# Patient Record
Sex: Female | Born: 1970 | Race: White | Hispanic: Yes | Marital: Married | Smoking: Never smoker
Health system: Southern US, Community
[De-identification: ages and names within clinical notes are randomized; demographics above are authoritative.]

## PROBLEM LIST (undated history)

## (undated) DIAGNOSIS — F419 Anxiety disorder, unspecified: Secondary | ICD-10-CM

## (undated) DIAGNOSIS — E785 Hyperlipidemia, unspecified: Secondary | ICD-10-CM

## (undated) DIAGNOSIS — R42 Dizziness and giddiness: Secondary | ICD-10-CM

## (undated) DIAGNOSIS — J309 Allergic rhinitis, unspecified: Secondary | ICD-10-CM

## (undated) DIAGNOSIS — M159 Polyosteoarthritis, unspecified: Secondary | ICD-10-CM

## (undated) DIAGNOSIS — K589 Irritable bowel syndrome without diarrhea: Secondary | ICD-10-CM

## (undated) DIAGNOSIS — M549 Dorsalgia, unspecified: Secondary | ICD-10-CM

## (undated) DIAGNOSIS — K219 Gastro-esophageal reflux disease without esophagitis: Secondary | ICD-10-CM

## (undated) HISTORY — DX: Dorsalgia, unspecified: M54.9

## (undated) HISTORY — DX: Irritable bowel syndrome, unspecified: K58.9

## (undated) HISTORY — PX: NO PAST SURGERIES: SHX2092

## (undated) HISTORY — DX: Dizziness and giddiness: R42

## (undated) HISTORY — DX: Gastro-esophageal reflux disease without esophagitis: K21.9

## (undated) HISTORY — DX: Allergic rhinitis, unspecified: J30.9

## (undated) HISTORY — DX: Anxiety disorder, unspecified: F41.9

## (undated) HISTORY — DX: Polyosteoarthritis, unspecified: M15.9

## (undated) HISTORY — DX: Hyperlipidemia, unspecified: E78.5

---

## 2016-09-13 DIAGNOSIS — E559 Vitamin D deficiency, unspecified: Secondary | ICD-10-CM | POA: Diagnosis not present

## 2016-09-13 DIAGNOSIS — J22 Unspecified acute lower respiratory infection: Secondary | ICD-10-CM | POA: Diagnosis not present

## 2016-09-13 DIAGNOSIS — R42 Dizziness and giddiness: Secondary | ICD-10-CM | POA: Diagnosis not present

## 2016-09-13 DIAGNOSIS — E785 Hyperlipidemia, unspecified: Secondary | ICD-10-CM | POA: Diagnosis not present

## 2016-10-02 DIAGNOSIS — R200 Anesthesia of skin: Secondary | ICD-10-CM | POA: Diagnosis not present

## 2016-10-02 DIAGNOSIS — J22 Unspecified acute lower respiratory infection: Secondary | ICD-10-CM | POA: Diagnosis not present

## 2016-10-02 DIAGNOSIS — E785 Hyperlipidemia, unspecified: Secondary | ICD-10-CM | POA: Diagnosis not present

## 2016-10-12 DIAGNOSIS — R509 Fever, unspecified: Secondary | ICD-10-CM | POA: Diagnosis not present

## 2016-10-12 DIAGNOSIS — J0100 Acute maxillary sinusitis, unspecified: Secondary | ICD-10-CM | POA: Diagnosis not present

## 2016-10-12 DIAGNOSIS — J209 Acute bronchitis, unspecified: Secondary | ICD-10-CM | POA: Diagnosis not present

## 2016-12-29 DIAGNOSIS — R0789 Other chest pain: Secondary | ICD-10-CM | POA: Diagnosis not present

## 2016-12-29 DIAGNOSIS — K219 Gastro-esophageal reflux disease without esophagitis: Secondary | ICD-10-CM | POA: Diagnosis not present

## 2017-02-01 DIAGNOSIS — Z1231 Encounter for screening mammogram for malignant neoplasm of breast: Secondary | ICD-10-CM | POA: Diagnosis not present

## 2018-02-11 DIAGNOSIS — K219 Gastro-esophageal reflux disease without esophagitis: Secondary | ICD-10-CM | POA: Diagnosis not present

## 2018-02-11 DIAGNOSIS — Z309 Encounter for contraceptive management, unspecified: Secondary | ICD-10-CM | POA: Diagnosis not present

## 2018-02-19 DIAGNOSIS — Z1231 Encounter for screening mammogram for malignant neoplasm of breast: Secondary | ICD-10-CM | POA: Diagnosis not present

## 2018-08-08 DIAGNOSIS — M549 Dorsalgia, unspecified: Secondary | ICD-10-CM | POA: Diagnosis not present

## 2018-08-08 DIAGNOSIS — M159 Polyosteoarthritis, unspecified: Secondary | ICD-10-CM | POA: Diagnosis not present

## 2018-08-08 DIAGNOSIS — K219 Gastro-esophageal reflux disease without esophagitis: Secondary | ICD-10-CM | POA: Diagnosis not present

## 2018-08-22 DIAGNOSIS — M549 Dorsalgia, unspecified: Secondary | ICD-10-CM | POA: Diagnosis not present

## 2018-08-22 DIAGNOSIS — E785 Hyperlipidemia, unspecified: Secondary | ICD-10-CM | POA: Diagnosis not present

## 2018-08-22 DIAGNOSIS — M159 Polyosteoarthritis, unspecified: Secondary | ICD-10-CM | POA: Diagnosis not present

## 2018-08-27 DIAGNOSIS — E785 Hyperlipidemia, unspecified: Secondary | ICD-10-CM | POA: Diagnosis not present

## 2018-08-27 DIAGNOSIS — K219 Gastro-esophageal reflux disease without esophagitis: Secondary | ICD-10-CM | POA: Diagnosis not present

## 2018-08-27 DIAGNOSIS — M159 Polyosteoarthritis, unspecified: Secondary | ICD-10-CM | POA: Diagnosis not present

## 2018-09-13 DIAGNOSIS — M159 Polyosteoarthritis, unspecified: Secondary | ICD-10-CM | POA: Diagnosis not present

## 2018-09-13 DIAGNOSIS — K219 Gastro-esophageal reflux disease without esophagitis: Secondary | ICD-10-CM | POA: Diagnosis not present

## 2018-09-13 DIAGNOSIS — E785 Hyperlipidemia, unspecified: Secondary | ICD-10-CM | POA: Diagnosis not present

## 2018-10-18 DIAGNOSIS — E785 Hyperlipidemia, unspecified: Secondary | ICD-10-CM | POA: Diagnosis not present

## 2018-10-18 DIAGNOSIS — J069 Acute upper respiratory infection, unspecified: Secondary | ICD-10-CM | POA: Diagnosis not present

## 2018-10-18 DIAGNOSIS — M159 Polyosteoarthritis, unspecified: Secondary | ICD-10-CM | POA: Diagnosis not present

## 2018-11-14 DIAGNOSIS — M159 Polyosteoarthritis, unspecified: Secondary | ICD-10-CM | POA: Diagnosis not present

## 2018-11-14 DIAGNOSIS — M25512 Pain in left shoulder: Secondary | ICD-10-CM | POA: Diagnosis not present

## 2018-11-14 DIAGNOSIS — E785 Hyperlipidemia, unspecified: Secondary | ICD-10-CM | POA: Diagnosis not present

## 2018-11-17 DIAGNOSIS — Z09 Encounter for follow-up examination after completed treatment for conditions other than malignant neoplasm: Secondary | ICD-10-CM | POA: Diagnosis not present

## 2018-11-28 DIAGNOSIS — M159 Polyosteoarthritis, unspecified: Secondary | ICD-10-CM | POA: Diagnosis not present

## 2018-11-28 DIAGNOSIS — K219 Gastro-esophageal reflux disease without esophagitis: Secondary | ICD-10-CM | POA: Diagnosis not present

## 2018-11-28 DIAGNOSIS — J208 Acute bronchitis due to other specified organisms: Secondary | ICD-10-CM | POA: Diagnosis not present

## 2021-06-03 ENCOUNTER — Encounter: Payer: Self-pay | Admitting: Cardiology

## 2021-06-03 DIAGNOSIS — K589 Irritable bowel syndrome without diarrhea: Secondary | ICD-10-CM | POA: Insufficient documentation

## 2021-06-03 DIAGNOSIS — K219 Gastro-esophageal reflux disease without esophagitis: Secondary | ICD-10-CM | POA: Insufficient documentation

## 2021-06-03 DIAGNOSIS — J309 Allergic rhinitis, unspecified: Secondary | ICD-10-CM | POA: Insufficient documentation

## 2021-06-03 DIAGNOSIS — E785 Hyperlipidemia, unspecified: Secondary | ICD-10-CM | POA: Insufficient documentation

## 2021-06-03 DIAGNOSIS — M159 Polyosteoarthritis, unspecified: Secondary | ICD-10-CM

## 2021-06-07 DIAGNOSIS — F419 Anxiety disorder, unspecified: Secondary | ICD-10-CM | POA: Insufficient documentation

## 2021-06-07 DIAGNOSIS — R42 Dizziness and giddiness: Secondary | ICD-10-CM | POA: Insufficient documentation

## 2021-06-07 DIAGNOSIS — M549 Dorsalgia, unspecified: Secondary | ICD-10-CM | POA: Insufficient documentation

## 2021-06-09 ENCOUNTER — Ambulatory Visit: Payer: Self-pay | Admitting: Cardiology

## 2021-08-03 ENCOUNTER — Ambulatory Visit: Payer: Self-pay | Admitting: Cardiology

## 2021-09-04 NOTE — Progress Notes (Signed)
Cardiology Office Note:    Date:  09/06/2021   ID:  Sandra Villanueva, DOB [birth year], MRN [id number]  PCP:  Simone Curia, MD  Cardiologist:  Norman Herrlich, MD   Referring MD: Simone Curia, MD  ASSESSMENT:    1. Familial hyperlipidemia, high LDL   2. Chest pain of uncertain etiology    PLAN:    In order of problems listed above:  She has obvious familial hyperlipidemia and adequate response to statin we will add Zetia recheck in 2 months likely need referral to lipid clinic genetics can even injectable therapy.  Stressed importance of compliance with the medication At very high risk of vascular disease although her chest pain is not typical angina and asked her to do a cardiac CTA which reveals both calcium score the presence or absence of CAD.  Next appointment recheck lipids 2 months LP(a) and see me in the office afterwards   Medication Adjustments/Labs and Tests Ordered: Current medicines are reviewed at length with the patient today.  Concerns regarding medicines are outlined above.  No orders of the defined types were placed in this encounter.  No orders of the defined types were placed in this encounter.    Chief Complaint  Patient presents with   Hyperlipidemia    History of Present Illness:    Sandra Villanueva is a 51 y.o. female who is being seen today for the evaluation of severe hyperlipidemia/familial hyperlipidemia at the request of Simone Curia, MD. She has a history of hyperlipidemia on high intensity statin atorvastatin and Zetia.  She had a  CT of the head performed 03/11/2020 showing intracranial atherosclerosis.  She has severe dyslipidemia lipid profile 04/15/2021 cholesterol 494 LDL 421 triglycerides 1 1 HDL 57. She was noted to have abnormal liver function test transaminases were normal and there is minimal elevation of alkaline phosphatase.  She was previously seen for the primary care at [facility] 2016 and there is no reference to hyperlipidemia or  labs available for review. She has been on a statin now for several years She has no known heart disease congenital rheumatic but recently she has been having nonexertional chest pain substernal not severe or sustained not predictable. She has not had edema shortness of breath orthopnea or syncope.  Years ago she had rapid heartbeat was seen either in my previous practice or [facility] for monitoring and there is no abnormality.  Past Medical History:  Diagnosis Date   Allergic rhinitis    Anxiety    Back pain    Chronic GERD    Dizziness    Generalized osteoarthrosis of multiple sites    Hyperlipidemia    Irritable bowel syndrome     Past Surgical History:  Procedure Laterality Date   NO PAST SURGERIES      Current Medications: Current Meds  Medication Sig   albuterol (VENTOLIN HFA) 108 (90 Base) MCG/ACT inhaler Inhale 2 puffs into the lungs every 4 (four) hours as needed for wheezing or shortness of breath.   atorvastatin (LIPITOR) 80 MG tablet Take 80 mg by mouth daily.   cetirizine (ZYRTEC) 10 MG tablet Take 10 mg by mouth daily.   clotrimazole-betamethasone (LOTRISONE) cream Apply 1 application topically 2 (two) times daily.   meloxicam (MOBIC) 7.5 MG tablet Take 7.5 mg by mouth 2 (two) times daily.   pantoprazole (PROTONIX) 40 MG tablet Take 40 mg by mouth 2 (two) times daily.     Allergies:   Patient has no known allergies.  Social History   Socioeconomic History   Marital status: Married    Spouse name: Not on file   Number of children: Not on file   Years of education: Not on file   Highest education level: Not on file  Occupational History   Not on file  Tobacco Use   Smoking status: Never    Passive exposure: Never   Smokeless tobacco: Never  Vaping Use   Vaping Use: Never used  Substance and Sexual Activity   Alcohol use: Never   Drug use: Never   Sexual activity: Not on file  Other Topics Concern   Not on file  Social History Narrative    Not on file   Social Determinants of Health   Financial Resource Strain: Not on file  Food Insecurity: Not on file  Transportation Needs: Not on file  Physical Activity: Not on file  Stress: Not on file  Social Connections: Not on file     Family History: The patient's family history includes Asthma in her brother; Hyperlipidemia in her mother; Hypertension in her mother. She has a brother with elevated cholesterol taking medication and other siblings in [place] with no known heart disease in her mother who died at age 71 with CAD.  She does not know her father's medical history  ROS:   ROS Please see the history of present illness.     All other systems reviewed and are negative.  EKGs/Labs/Other Studies Reviewed:    The following studies were reviewed today:   EKG:  EKG is  ordered today.  The ekg ordered today is personally reviewed and demonstrates SRTH normal EKG   Physical Exam:    VS:  BP 139/83 (BP Location: Right Arm)    Pulse 80    Ht 4' 10.5" (1.486 m)    Wt 175 lb 9.6 oz (79.7 kg)    SpO2 97%    BMI 36.08 kg/m     Wt Readings from Last 3 Encounters:  09/06/21 175 lb 9.6 oz (79.7 kg)  04/16/21 175 lb (79.4 kg)     GEN: She has no xanthoma or xanthelasma no tendon xanthomas well nourished, well developed in no acute distress HEENT: Normal NECK: No JVD; No carotid bruits LYMPHATICS: No lymphadenopathy CARDIAC: RRR, no murmurs, rubs, gallops RESPIRATORY:  Clear to auscultation without rales, wheezing or rhonchi  ABDOMEN: Soft, non-tender, non-distended MUSCULOSKELETAL:  No edema; No deformity  SKIN: Warm and dry NEUROLOGIC:  Alert and oriented x 3 PSYCHIATRIC:  Normal affect     Signed, Norman Herrlich, MD  09/06/2021 1:54 PM    [facility]

## 2021-09-06 ENCOUNTER — Ambulatory Visit (INDEPENDENT_AMBULATORY_CARE_PROVIDER_SITE_OTHER): Payer: Self-pay | Admitting: Cardiology

## 2021-09-06 ENCOUNTER — Encounter: Payer: Self-pay | Admitting: Cardiology

## 2021-09-06 ENCOUNTER — Other Ambulatory Visit: Payer: Self-pay

## 2021-09-06 VITALS — BP 139/83 | HR 80 | Ht 58.5 in | Wt 175.6 lb

## 2021-09-06 DIAGNOSIS — R079 Chest pain, unspecified: Secondary | ICD-10-CM

## 2021-09-06 DIAGNOSIS — E7849 Other hyperlipidemia: Secondary | ICD-10-CM

## 2021-09-06 MED ORDER — EZETIMIBE 10 MG PO TABS
10.0000 mg | ORAL_TABLET | Freq: Every day | ORAL | 3 refills | Status: AC
Start: 2021-09-06 — End: ?

## 2021-09-06 MED ORDER — METOPROLOL TARTRATE 100 MG PO TABS
100.0000 mg | ORAL_TABLET | Freq: Once | ORAL | 0 refills | Status: DC
Start: 2021-09-06 — End: 2021-09-23

## 2021-09-06 NOTE — Patient Instructions (Signed)
Medication Instructions:  Your physician has recommended you make the following change in your medication:   START: Zetia 10 mg daily  *If you need a refill on your cardiac medications before your next appointment, please call your pharmacy*   Lab Work: Your physician recommends that you return for lab work in:   Labs in 2 months: Lipids, Lpa Labs 1 week before CT If you have labs (blood work) drawn today and your tests are completely normal, you will receive your results only by: MyChart Message (if you have MyChart) OR A paper copy in the mail If you have any lab test that is abnormal or we need to change your treatment, we will call you to review the results.   Testing/Procedures:   Your cardiac CT will be scheduled at one of the below locations:   [facility] [address] [place], [place] [address] [phone]  OR  [facility] [address] [place], [place] [address] [phone]  If scheduled at [facility], please arrive at the [facility] main entrance (entrance A) of [facility] 30 minutes prior to test start time. You can use the FREE valet parking offered at the main entrance (encouraged to control the heart rate for the test) Proceed to the [facility] Radiology Department (first floor) to check-in and test prep.  If scheduled at [facility], please arrive 15 mins early for check-in and test prep.  Please follow these instructions carefully (unless otherwise directed):  On the Night Before the Test: Be sure to Drink plenty of water. Do not consume any caffeinated/decaffeinated beverages or chocolate 12 hours prior to your test. Do not take any antihistamines 12 hours prior to your test.  On the Day of the Test: Drink plenty of water until 1 hour prior to the test. Do not eat any food 4 hours prior to the test. You may take your regular medications  prior to the test.  Take metoprolol (Lopressor) two hours prior to test. FEMALES- please wear underwire-free bra if available, avoid dresses & tight clothing       After the Test: Drink plenty of water. After receiving IV contrast, you may experience a mild flushed feeling. This is normal. On occasion, you may experience a mild rash up to 24 hours after the test. This is not dangerous. If this occurs, you can take Benadryl 25 mg and increase your fluid intake. If you experience trouble breathing, this can be serious. If it is severe call 911 IMMEDIATELY. If it is mild, please call our office.  We will call to schedule your test 2-4 weeks out understanding that some insurance companies will need an authorization prior to the service being performed.   For non-scheduling related questions, please contact the cardiac imaging nurse navigator should you have any questions/concerns: Rockwell Alexandria, Cardiac Imaging Nurse Navigator Larey Brick, Cardiac Imaging Nurse Navigator [facility] Direct Office Dial: [phone]   For scheduling needs, including cancellations and rescheduling, please call [place], [phone].    Follow-Up: At [facility], you and your health needs are our priority.  As part of our continuing mission to provide you with exceptional heart care, we have created designated Provider Care Teams.  These Care Teams include your primary Cardiologist (physician) and Advanced Practice Providers (APPs -  Physician Assistants and Nurse Practitioners) who all work together to provide you with the care you need, when you need it.  We recommend signing up for the patient portal called "MyChart".  Sign up information is provided on this After Visit Summary.  MyChart is used to connect with patients for Virtual Visits (Telemedicine).  Patients are able to view lab/test results, encounter notes, upcoming appointments, etc.  Non-urgent messages can be sent to  your provider as well.   To learn more about what you can do with MyChart, go to [email or web address].    Your next appointment:   2 month(s)  The format for your next appointment:   In Person  Provider:   Norman Herrlich, MD    Other Instructions None

## 2021-09-23 ENCOUNTER — Telehealth (HOSPITAL_COMMUNITY): Payer: Self-pay | Admitting: Emergency Medicine

## 2021-09-23 ENCOUNTER — Telehealth: Payer: Self-pay | Admitting: Cardiology

## 2021-09-23 MED ORDER — METOPROLOL TARTRATE 100 MG PO TABS: 100.0000 mg | ORAL_TABLET | Freq: Once | ORAL | 0 refills | Status: DC

## 2021-09-23 NOTE — Telephone Encounter (Signed)
Pt states she misplaced her medication for her CT scan. ? ?Please advise best number to reach PT  ?([phone]) ? ?

## 2021-09-23 NOTE — Telephone Encounter (Signed)
RX has been to [facility].  ?

## 2021-09-23 NOTE — Telephone Encounter (Signed)
Reaching out to patient to offer assistance regarding upcoming cardiac imaging study; pt verbalizes understanding of appt date/time, parking situation and where to check in, pre-test NPO status and medications ordered, and verified current allergies; name and call back number provided for further questions should they arise Sandra Gilham RN Navigator Cardiac Imaging [facility] [phone] office [phone] cell  100mg metoprolol  

## 2021-09-26 ENCOUNTER — Ambulatory Visit (HOSPITAL_COMMUNITY): Payer: 59

## 2021-10-06 ENCOUNTER — Telehealth (HOSPITAL_COMMUNITY): Payer: Self-pay | Admitting: *Deleted

## 2021-10-06 NOTE — Telephone Encounter (Signed)
Reaching out to patient to offer assistance regarding upcoming cardiac imaging study; pt's husband answered phone and  stated she is sick and would like to reschedule.  He didn't have a date but was informed that we would reach out later to reschedule. ? ?Larey Brick RN Navigator Cardiac Imaging ?[facility] ?[phone] office ?[phone] cell ? ?

## 2021-10-07 ENCOUNTER — Ambulatory Visit (HOSPITAL_COMMUNITY): Admission: RE | Admit: 2021-10-07 | Payer: 59 | Source: Ambulatory Visit

## 2021-11-09 ENCOUNTER — Ambulatory Visit: Payer: Self-pay | Admitting: Cardiology

## 2021-11-09 ENCOUNTER — Telehealth (HOSPITAL_COMMUNITY): Payer: Self-pay | Admitting: *Deleted

## 2021-11-09 ENCOUNTER — Telehealth: Payer: Self-pay

## 2021-11-09 ENCOUNTER — Other Ambulatory Visit (HOSPITAL_COMMUNITY): Payer: Self-pay | Admitting: *Deleted

## 2021-11-09 MED ORDER — METOPROLOL TARTRATE 100 MG PO TABS: 100.0000 mg | ORAL_TABLET | Freq: Once | ORAL | 0 refills | Status: AC

## 2021-11-09 NOTE — Telephone Encounter (Signed)
Reaching out to patient to offer assistance regarding upcoming cardiac imaging study; pt's husband answered the phone and verbalizes understanding of appt date/time, parking situation and where to check in, pre-test NPO status and medications ordered, and verified current allergies; name and call back number provided for further questions should they arise ? ?Larey Brick RN Navigator Cardiac Imaging ?[facility] ?[phone] office ?[phone] cell ? ?Patient to take 100mg  metoprolol tartrate two hours prior to her cardiac scan. They are aware to arrive at 12:30pm.  ?

## 2021-11-09 NOTE — Telephone Encounter (Signed)
Per Dr Dulce Sellar will reschedule patients appointment for today due to patient not having CTA done as previous ordered. I spoke to patients husband informed him that we will cancel todays appointment and reschedule it  after patient has CTA done. Husband prefers someone who speaks spanish to call back to reschdule test and follow appointment.   ?

## 2021-11-09 NOTE — Progress Notes (Deleted)
Cardiology Office Note:    Date:  11/09/2021   ID:  [name], DOB 02/07/1971, MRN [id number]  PCP:  Simone Curia, MD  Cardiologist:  Norman Herrlich, MD    Referring MD: Simone Curia, MD    ASSESSMENT:    No diagnosis found. PLAN:    In order of problems listed above:  ***   Next appointment: ***   Medication Adjustments/Labs and Tests Ordered: Current medicines are reviewed at length with the patient today.  Concerns regarding medicines are outlined above.  No orders of the defined types were placed in this encounter.  No orders of the defined types were placed in this encounter.   No chief complaint on file.   History of Present Illness:    Sandra Villanueva is a 51 y.o. female with a hx of severe hyperlipidemia/familial hyperlipidemia  last seen 09/06/2021. Compliance with diet, lifestyle and medications: *** Past Medical History:  Diagnosis Date   Allergic rhinitis    Anxiety    Back pain    Chronic GERD    Dizziness    Generalized osteoarthrosis of multiple sites    Hyperlipidemia    Irritable bowel syndrome     Past Surgical History:  Procedure Laterality Date   NO PAST SURGERIES      Current Medications: No outpatient medications have been marked as taking for the 11/09/21 encounter (Appointment) with Baldo Daub, MD.     Allergies:   Patient has no known allergies.   Social History   Socioeconomic History   Marital status: Married    Spouse name: Not on file   Number of children: Not on file   Years of education: Not on file   Highest education level: Not on file  Occupational History   Not on file  Tobacco Use   Smoking status: Never    Passive exposure: Never   Smokeless tobacco: Never  Vaping Use   Vaping Use: Never used  Substance and Sexual Activity   Alcohol use: Never   Drug use: Never   Sexual activity: Not on file  Other Topics Concern   Not on file  Social History Narrative   Not on file   Social Determinants of Health    Financial Resource Strain: Not on file  Food Insecurity: Not on file  Transportation Needs: Not on file  Physical Activity: Not on file  Stress: Not on file  Social Connections: Not on file     Family History: The patient's ***family history includes Asthma in her brother; Hyperlipidemia in her mother; Hypertension in her mother. ROS:   Please see the history of present illness.    All other systems reviewed and are negative.  EKGs/Labs/Other Studies Reviewed:    The following studies were reviewed today:  EKG:  EKG ordered today and personally reviewed.  The ekg ordered today demonstrates ***  Recent Labs: No results found for requested labs within last 8760 hours.  Recent Lipid Panel No results found for: CHOL, TRIG, HDL, CHOLHDL, VLDL, LDLCALC, LDLDIRECT  Physical Exam:    VS:  There were no vitals taken for this visit.    Wt Readings from Last 3 Encounters:  09/06/21 175 lb 9.6 oz (79.7 kg)  04/16/21 175 lb (79.4 kg)     GEN: *** Well nourished, well developed in no acute distress HEENT: Normal NECK: No JVD; No carotid bruits LYMPHATICS: No lymphadenopathy CARDIAC: ***RRR, no murmurs, rubs, gallops RESPIRATORY:  Clear to auscultation without rales, wheezing or rhonchi  ABDOMEN: Soft, non-tender, non-distended MUSCULOSKELETAL:  No edema; No deformity  SKIN: Warm and dry NEUROLOGIC:  Alert and oriented x 3 PSYCHIATRIC:  Normal affect    Signed, Norman Herrlich, MD  11/09/2021 8:14 AM    [facility]

## 2021-11-10 ENCOUNTER — Ambulatory Visit (HOSPITAL_COMMUNITY)
Admission: RE | Admit: 2021-11-10 | Discharge: 2021-11-10 | Disposition: A | Discharge status: Home / Self Care | Payer: 59 | Source: Ambulatory Visit | Attending: Cardiology | Admitting: Cardiology

## 2021-11-10 ENCOUNTER — Encounter (HOSPITAL_COMMUNITY): Payer: Self-pay

## 2021-11-10 DIAGNOSIS — R079 Chest pain, unspecified: Secondary | ICD-10-CM | POA: Insufficient documentation

## 2021-11-10 MED ORDER — IOHEXOL 350 MG/ML SOLN
100.0000 mL | Freq: Once | INTRAVENOUS | Status: AC | PRN
  Administered 2021-11-10: 100 mL via INTRAVENOUS

## 2021-11-10 MED ORDER — NITROGLYCERIN 0.4 MG SL SUBL
0.8000 mg | SUBLINGUAL_TABLET | Freq: Once | SUBLINGUAL | Status: AC
  Administered 2021-11-10: 0.8 mg via SUBLINGUAL

## 2021-11-10 MED ORDER — NITROGLYCERIN 0.4 MG SL SUBL
SUBLINGUAL_TABLET | SUBLINGUAL | Status: AC
  Filled 2021-11-10: qty 2

## 2021-11-10 NOTE — Progress Notes (Signed)
CT scan completed. Tolerated well. D/C home ambulatory with husband. Awake and alert. In no distress. 

## 2021-11-11 ENCOUNTER — Telehealth: Payer: Self-pay

## 2021-11-11 NOTE — Telephone Encounter (Signed)
-----   Message from Samson Frederic, RN sent at 11/11/2021  9:59 AM EDT ----- ? ?----- Message ----- ?From: Baldo Daub, MD ?Sent: 11/10/2021   5:08 PM EDT ?To: Cv Div Ash/Hp Triage ? ?This was worthwhile to do ? ?First part of the test is a score that tells you the amount of atherosclerosis and hers is very high for age because of her familial hyperlipidemia ? ?It is critical that she has effective treatment for her lipids ? ?Fortunately there is no severe obstruction of the coronary arteries there are segments where it is minimal. ? ?

## 2021-11-11 NOTE — Telephone Encounter (Signed)
Patient notified of results. She has an upcoming appt where she want to discuss the results in more detail.  ?

## 2021-12-03 NOTE — Progress Notes (Deleted)
Cardiology Office Note:    Date:  12/03/2021   ID:  Sandra Villanueva, DOB [birth year], MRN [id number]  PCP:  Simone Curia, MD  Cardiologist:  Norman Herrlich, MD    Referring MD: Simone Curia, MD    ASSESSMENT:    No diagnosis found. PLAN:    In order of problems listed above:  ***   Next appointment: ***   Medication Adjustments/Labs and Tests Ordered: Current medicines are reviewed at length with the patient today.  Concerns regarding medicines are outlined above.  No orders of the defined types were placed in this encounter.  No orders of the defined types were placed in this encounter.   No chief complaint on file.   History of Present Illness:    Sandra Villanueva is a 51 y.o. female with a hx of severe hyperlipidemia/familial hyperlipidemia  last seen 09/06/2021.  She had a  CT of the head performed 03/11/2020 showing intracranial atherosclerosis.   She has severe dyslipidemia lipid profile 04/15/2021 cholesterol 494 LDL 421 triglycerides 1 1 HDL 57.  Compliance with diet, lifestyle and medications: ***  Cardiac CTA 11/10/2021: IMPRESSION: 1. Coronary calcium score of 37.6. This was 97th percentile for age-, sex, and race-matched controls.   2. Normal coronary origin with right dominance.   3. Minimal CAD (<25%) in the LAD/LCX.   4. Distal LAD myocardial bridge (normal variant).   Past Medical History:  Diagnosis Date   Allergic rhinitis    Anxiety    Back pain    Chronic GERD    Dizziness    Generalized osteoarthrosis of multiple sites    Hyperlipidemia    Irritable bowel syndrome     Past Surgical History:  Procedure Laterality Date   NO PAST SURGERIES      Current Medications: No outpatient medications have been marked as taking for the 12/06/21 encounter (Appointment) with Baldo Daub, MD.     Allergies:   Patient has no known allergies.   Social History   Socioeconomic History   Marital status: Married    Spouse name: Not on file   Number  of children: Not on file   Years of education: Not on file   Highest education level: Not on file  Occupational History   Not on file  Tobacco Use   Smoking status: Never    Passive exposure: Never   Smokeless tobacco: Never  Vaping Use   Vaping Use: Never used  Substance and Sexual Activity   Alcohol use: Never   Drug use: Never   Sexual activity: Not on file  Other Topics Concern   Not on file  Social History Narrative   Not on file   Social Determinants of Health   Financial Resource Strain: Not on file  Food Insecurity: Not on file  Transportation Needs: Not on file  Physical Activity: Not on file  Stress: Not on file  Social Connections: Not on file     Family History: The patient's ***family history includes Asthma in her brother; Hyperlipidemia in her mother; Hypertension in her mother. ROS:   Please see the history of present illness.    All other systems reviewed and are negative.  EKGs/Labs/Other Studies Reviewed:    The following studies were reviewed today:  EKG:  EKG ordered today and personally reviewed.  The ekg ordered today demonstrates ***  Recent Labs: No results found for requested labs within last 8760 hours.  Recent Lipid Panel No results found for: CHOL, TRIG, HDL, CHOLHDL, VLDL,  LDLCALC, LDLDIRECT  Physical Exam:    VS:  There were no vitals taken for this visit.    Wt Readings from Last 3 Encounters:  09/06/21 175 lb 9.6 oz (79.7 kg)  04/16/21 175 lb (79.4 kg)     GEN: *** Well nourished, well developed in no acute distress HEENT: Normal NECK: No JVD; No carotid bruits LYMPHATICS: No lymphadenopathy CARDIAC: ***RRR, no murmurs, rubs, gallops RESPIRATORY:  Clear to auscultation without rales, wheezing or rhonchi  ABDOMEN: Soft, non-tender, non-distended MUSCULOSKELETAL:  No edema; No deformity  SKIN: Warm and dry NEUROLOGIC:  Alert and oriented x 3 PSYCHIATRIC:  Normal affect    Signed, Norman Herrlich, MD  12/03/2021 1:29 PM     [facility]

## 2021-12-06 ENCOUNTER — Ambulatory Visit: Payer: Self-pay | Admitting: Cardiology

## 2022-06-19 ENCOUNTER — Ambulatory Visit: Payer: Commercial Managed Care - HMO | Admitting: Internal Medicine

## 2022-06-19 ENCOUNTER — Encounter: Payer: Self-pay | Admitting: Internal Medicine

## 2022-06-19 VITALS — BP 126/82 | HR 85 | Temp 97.8°F | Resp 16 | Ht 62.0 in | Wt 171.8 lb

## 2022-06-19 DIAGNOSIS — K219 Gastro-esophageal reflux disease without esophagitis: Secondary | ICD-10-CM

## 2022-06-19 DIAGNOSIS — R079 Chest pain, unspecified: Secondary | ICD-10-CM | POA: Diagnosis not present

## 2022-06-19 MED ORDER — FAMOTIDINE 20 MG PO TABS: 20.0000 mg | ORAL_TABLET | Freq: Every day | ORAL | 0 refills | Status: AC

## 2022-06-19 NOTE — Progress Notes (Signed)
   Established Patient Office Visit  Subjective   Patient ID: Sandra Villanueva, female    DOB: 06-13-1971  Age: 51 y.o. MRN: [id number]  Chief Complaint  Patient presents with   Chest Pain    Continued chest pain    Chest Pain    51 years old female is here c/o chest pain some time it comes with eating and other time it comes out of blue. She take omeprazole without any effects. She denies any difficulty swallowing. No relation with exertion.    Review of Systems  Constitutional: Negative.   Respiratory: Negative.    Cardiovascular:  Positive for chest pain.  Neurological: Negative.       Objective:     BP 126/82 (BP Location: Left Arm, Patient Position: Sitting, Cuff Size: Normal)   Pulse 85   Temp 97.8 F (36.6 C) (Temporal)   Resp 16   Ht 5' 2"$  (1.575 m)   Wt 171 lb 12.8 oz (77.9 kg)   LMP 01/21/2022   SpO2 99%   BMI 31.42 kg/m    Physical Exam Constitutional:      Appearance: She is well-developed. She is obese.  HENT:     Head: Normocephalic and atraumatic.  Cardiovascular:     Rate and Rhythm: Normal rate and regular rhythm.  Pulmonary:     Effort: Pulmonary effort is normal.  Abdominal:     Palpations: Abdomen is soft.  Musculoskeletal:     Cervical back: Normal range of motion and neck supple.  Neurological:     General: No focal deficit present.     Mental Status: She is oriented to person, place, and time.      No results found for any visits on 06/19/22.   The ASCVD Risk score (Arnett DK, et al., 2019) failed to calculate for the following reasons:   Cannot find a previous HDL lab   Cannot find a previous total cholesterol lab    Assessment & Plan:   Problem List Items Addressed This Visit       Digestive   Chest pain due to gastrointestinal reflux disease - Primary   Relevant Medications   STOOL SOFTENER 100 MG capsule   omeprazole (PRILOSEC) 40 MG capsule   senna (SENOKOT) 8.6 MG TABS tablet    Return in about 1 month (around  07/19/2022).    Garwin Brothers, MD

## 2022-06-27 ENCOUNTER — Other Ambulatory Visit: Payer: Self-pay

## 2022-06-27 MED ORDER — ALBUTEROL SULFATE HFA 108 (90 BASE) MCG/ACT IN AERS: 2.0000 | INHALATION_SPRAY | RESPIRATORY_TRACT | 2 refills | Status: DC | PRN

## 2022-07-26 ENCOUNTER — Ambulatory Visit: Payer: Self-pay | Admitting: Internal Medicine

## 2023-04-13 ENCOUNTER — Other Ambulatory Visit: Payer: Self-pay | Admitting: Internal Medicine

## 2023-04-16 ENCOUNTER — Encounter: Payer: Self-pay | Admitting: Internal Medicine

## 2023-04-16 ENCOUNTER — Ambulatory Visit: Payer: Self-pay | Admitting: Internal Medicine

## 2023-04-16 VITALS — BP 122/84 | HR 67 | Temp 97.7°F | Resp 18 | Ht 60.0 in | Wt 174.2 lb

## 2023-04-16 DIAGNOSIS — E782 Mixed hyperlipidemia: Secondary | ICD-10-CM

## 2023-04-16 DIAGNOSIS — K5900 Constipation, unspecified: Secondary | ICD-10-CM | POA: Insufficient documentation

## 2023-04-16 DIAGNOSIS — K5901 Slow transit constipation: Secondary | ICD-10-CM

## 2023-04-16 DIAGNOSIS — K2900 Acute gastritis without bleeding: Secondary | ICD-10-CM | POA: Insufficient documentation

## 2023-04-16 MED ORDER — POLYETHYLENE GLYCOL 3350 17 G PO PACK: 17.0000 g | PACK | Freq: Every day | ORAL | 0 refills | Status: AC

## 2023-04-16 MED ORDER — OMEPRAZOLE 40 MG PO CPDR: 40.0000 mg | DELAYED_RELEASE_CAPSULE | Freq: Every day | ORAL | 2 refills | Status: AC

## 2023-04-16 NOTE — Assessment & Plan Note (Signed)
She take zetia and rosuvastatin 5 mg daily.

## 2023-04-16 NOTE — Assessment & Plan Note (Signed)
I will start her on miralax 17 gram PO daily in a glass of water.

## 2023-04-16 NOTE — Progress Notes (Signed)
Office Visit  Subjective   Patient ID: Sandra Villanueva   DOB: Jan 09, 1971   Age: 52 y.o.   MRN: [id number]   Chief Complaint Chief Complaint  Patient presents with   Gastroesophageal Reflux    Follow up     History of Present Illness 52 years old female is here for follow up. She has epigastric pain that radiate to her back. She says that sometime pain get worse with eating. No relation with exertion.   She also says that she has constipation.   Past Medical History Past Medical History:  Diagnosis Date   Allergic rhinitis    Anxiety    Back pain    Chronic GERD    Dizziness    Generalized osteoarthrosis of multiple sites    Hyperlipidemia    Irritable bowel syndrome      Allergies No Known Allergies   Review of Systems Review of Systems  Respiratory: Negative.    Cardiovascular: Negative.   Gastrointestinal:  Positive for constipation and heartburn.  Neurological: Negative.        Objective:    Vitals BP 122/84 (BP Location: Left Arm, Patient Position: Sitting, Cuff Size: Normal)   Pulse 67   Temp 97.7 F (36.5 C)   Resp 18   Ht 5' (1.524 m)   Wt 174 lb 4 oz (79 kg)   SpO2 99%   BMI 34.03 kg/m    Physical Examination Physical Exam Constitutional:      Appearance: Normal appearance.  HENT:     Head: Normocephalic and atraumatic.  Cardiovascular:     Rate and Rhythm: Normal rate and regular rhythm.     Heart sounds: Normal heart sounds.  Pulmonary:     Effort: Pulmonary effort is normal.     Breath sounds: Normal breath sounds.  Abdominal:     General: Bowel sounds are normal.     Palpations: Abdomen is soft.  Neurological:     General: No focal deficit present.     Mental Status: She is alert and oriented to person, place, and time.        Assessment & Plan:   Constipation I will start her on miralax 17 gram PO daily in a glass of water.   Hyperlipidemia She take zetia and rosuvastatin 5 mg daily.     Return in about 2 months  (around 06/16/2023).   Eloisa Northern, MD

## 2023-05-21 ENCOUNTER — Ambulatory Visit: Payer: Self-pay | Admitting: Internal Medicine

## 2023-05-30 ENCOUNTER — Encounter: Payer: Self-pay | Admitting: Student

## 2023-05-30 ENCOUNTER — Ambulatory Visit: Payer: Self-pay | Admitting: Student

## 2023-05-30 VITALS — BP 130/84 | HR 78 | Temp 98.2°F | Ht 60.0 in | Wt 171.0 lb

## 2023-05-30 DIAGNOSIS — M25561 Pain in right knee: Secondary | ICD-10-CM

## 2023-05-30 DIAGNOSIS — M25511 Pain in right shoulder: Secondary | ICD-10-CM | POA: Insufficient documentation

## 2023-05-30 DIAGNOSIS — M546 Pain in thoracic spine: Secondary | ICD-10-CM

## 2023-05-30 DIAGNOSIS — M25512 Pain in left shoulder: Secondary | ICD-10-CM

## 2023-05-30 MED ORDER — METHOCARBAMOL 750 MG PO TABS: 750.0000 mg | ORAL_TABLET | Freq: Two times a day (BID) | ORAL | 0 refills | Status: AC | PRN

## 2023-05-30 NOTE — Progress Notes (Addendum)
Acute Office Visit  Subjective:     Patient ID: Sandra Villanueva, female    DOB: [birth year], 52 y.o.   MRN: [id number]  Chief Complaint  Patient presents with   Pain    Bilateral shoulder pain today and Rt. Knee pain x 2 weeks. Upper back pain, long time. Dr in the past gave her a steroid injection in her back.    HPI  Sandra Villanueva is a 52 year old Hispanic female who presents with her husband for complaints of right knee pain with radiation to the thigh and lower back for 3 weeks.  She reports the pain is worse when getting up and after sitting for a while.  Today she endorses bilateral shoulders, right knee pain, and upper back pain. She denies history of injuries, MVC, or falls. 5 or 6 years ago she had a steroid injection in her back by an orthopedist here in [place].  The patient was seen at [facility] urgent care on 10/24 and prescribed course of prednisone 20 mg and naproxen 500 mg, she says this provided little relief. . She continues using tylenol daily.  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  Review of Systems  Constitutional: Negative.   Cardiovascular: Negative.   Gastrointestinal:  Positive for constipation.  Genitourinary:  Positive for dysuria and frequency. Negative for flank pain.  Musculoskeletal:  Positive for back pain, joint pain and neck pain.  Neurological: Negative.   Psychiatric/Behavioral: Negative.          Objective:    BP 130/84 (BP Location: Left Arm)   Pulse 78   Temp 98.2 F (36.8 C) (Temporal)   Ht 5' (1.524 m)   Wt 171 lb (77.6 kg)   SpO2 98%   BMI 33.40 kg/m    Physical Exam Vitals reviewed.  Constitutional:      Appearance: Normal appearance. She is obese.  Cardiovascular:     Rate and Rhythm: Normal rate and regular rhythm.     Pulses: Normal pulses.     Heart sounds: Normal heart sounds.  Pulmonary:     Effort: Pulmonary effort is normal.     Breath sounds: Normal breath  sounds.  Abdominal:     General: Bowel sounds are normal.     Palpations: Abdomen is soft.  Musculoskeletal:     Right shoulder: Tenderness present.     Left shoulder: Tenderness present.     Cervical back: Tenderness present.     Thoracic back: Tenderness present.     Right knee: Swelling present. No deformity. Normal range of motion.     Left knee: Normal.  Skin:    General: Skin is warm and dry.     Capillary Refill: Capillary refill takes less than 2 seconds.  Neurological:     General: No focal deficit present.     Mental Status: She is alert and oriented to person, place, and time.     No results found for any visits on 05/30/23.      Assessment & Plan:   Problem List Items Addressed This Visit     Back pain    Same as above.      Relevant Medications   naproxen (NAPROSYN) 500 MG tablet   methocarbamol (ROBAXIN-750) 750 MG tablet   Acute pain of right knee - Primary    Right knee X-ray showed very early osteoarthritis, limited to the medial joint compartment. She has completed prednisone and is currently using  naproxen and tylenol. I will send in a muscle relaxer.  Checking a BMP kidney function.  Information provided about [facility] the patient is self-pay.      Relevant Medications   naproxen (NAPROSYN) 500 MG tablet   methocarbamol (ROBAXIN-750) 750 MG tablet   Other Relevant Orders   Basic metabolic panel   AMB referral to orthopedics   Bilateral shoulder pain    See note above      Relevant Medications   naproxen (NAPROSYN) 500 MG tablet    Meds ordered this encounter  Medications   methocarbamol (ROBAXIN-750) 750 MG tablet    Sig: Take 1 tablet (750 mg total) by mouth 2 (two) times daily as needed for muscle spasms.    Dispense:  60 tablet    Refill:  0    No follow-ups on file.  Edwena Blow, NP

## 2023-05-30 NOTE — Patient Instructions (Signed)
Referral to Orthopedics

## 2023-05-30 NOTE — Assessment & Plan Note (Signed)
Same as above

## 2023-05-30 NOTE — Assessment & Plan Note (Addendum)
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 She has completed prednisone and is currently using naproxen and tylenol. I will send in a muscle relaxer.  Checking a BMP kidney function.  Information provided about [facility] the patient is self-pay.

## 2023-05-30 NOTE — Assessment & Plan Note (Deleted)
She has completed prednisone and is currently using naproxen and tylenol. I will send in a muscle relaxer.  Referring to Orthopedic. Checking a BMP.

## 2023-05-30 NOTE — Assessment & Plan Note (Signed)
See note above

## 2023-05-31 LAB — BASIC METABOLIC PANEL
BUN/Creatinine Ratio: 13 (ref 9–23)
BUN: 8 mg/dL (ref 6–24)
CO2: 27 mmol/L (ref 20–29)
Calcium: 9.4 mg/dL (ref 8.7–10.2)
Chloride: 104 mmol/L (ref 96–106)
Creatinine, Ser: 0.62 mg/dL (ref 0.57–1.00)
Glucose: 89 mg/dL (ref 70–99)
Potassium: 4.8 mmol/L (ref 3.5–5.2)
Sodium: 141 mmol/L (ref 134–144)
eGFR: 108 mL/min/{1.73_m2} (ref >59)

## 2023-06-01 NOTE — Progress Notes (Signed)
Leak, Luetta Nutting, NP  Christianne Dolin, CMA I have reviewed your lab results.  Findings are all normal continue meloxicam as discussed.  Pt. Has been informed of results and recommendations.

## 2023-06-01 NOTE — Progress Notes (Signed)
I have reviewed your lab results.  Findings are all normal continue meloxicam as discussed.

## 2023-06-11 ENCOUNTER — Ambulatory Visit: Payer: Medicaid Other | Admitting: Internal Medicine

## 2023-12-14 IMAGING — CT CT HEART MORP W/ CTA COR W/ SCORE W/ CA W/CM &/OR W/O CM
4 of 7 series · 8 of 20 positions shown, 9 images · IV contrast (APPLIED)
Comparison: Chest x-ray dated September 13, 2021.
COMPARISON: Chest x-ray dated September 13, 2021.

Addendum:
EXAM:
OVER-READ INTERPRETATION  CT CHEST

The following report is an over-read performed by radiologist Dr.
Schoos Kosch [[email or web address]] on 11/10/2021. This
over-read does not include interpretation of cardiac or coronary
anatomy or pathology. The coronary calcium score/coronary CTA
interpretation by the cardiologist is attached.
CLINICAL DATA: Chest pain
Cardiac/Coronary CTA
TECHNIQUE: A non-contrast, gated CT scan was obtained with axial slices of 3 mm
through the heart for calcium scoring. Calcium scoring was performed
using the Agatston method. A 120 kV prospective, gated, contrast
cardiac scan was obtained. Gantry rotation speed was 250 msecs and
collimation was 0.6 mm. Two sublingual nitroglycerin tablets (0.8
mg) were given. The 3D data set was reconstructed in 5% intervals of
the 35-75% of the R-R cycle. Diastolic phases were analyzed on a
dedicated workstation using MPR, MIP, and VRT modes. The patient
received 95 cc of contrast.

[Series 6: best diast · axial · 0.39mm/px · z∈[+1208,+1240]mm · 2 of 239 slices shown, 3 images]
[im 80/239  vessel]
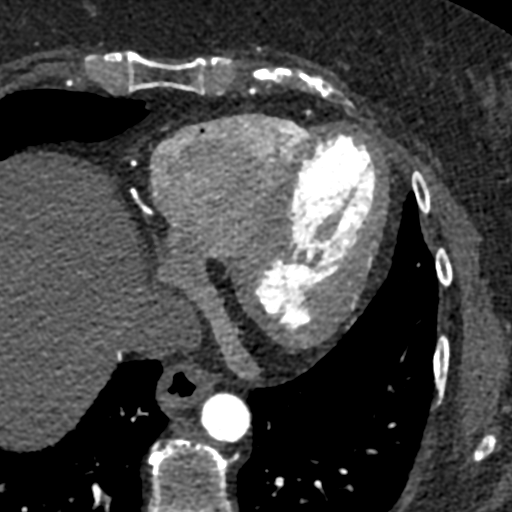
[im 80/239  lung]
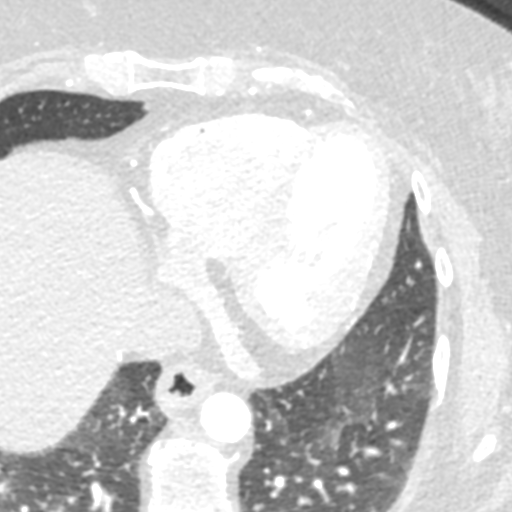
[im 159/239  vessel]
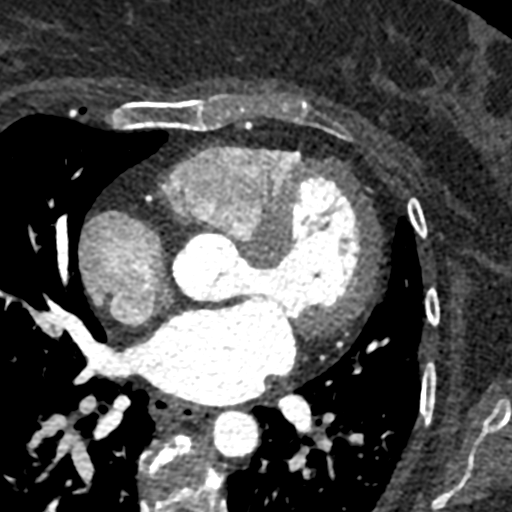

[Series 7: best syst · axial · 0.39mm/px · z∈[+1208,+1240]mm · 2 of 239 slices shown]
[im 80/239  vessel]
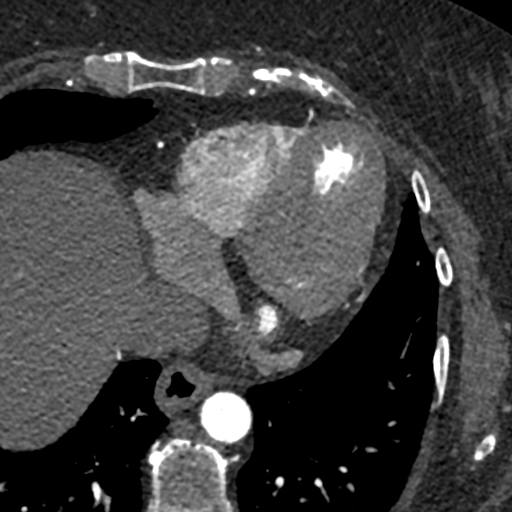
[im 159/239  vessel]
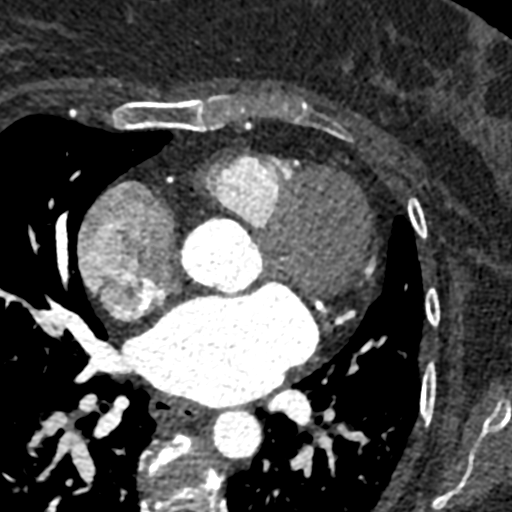

[Series 8: ts diast sharp · axial · 0.39mm/px · z∈[+1208,+1240]mm · 2 of 239 slices shown]
[im 80/239  lung]
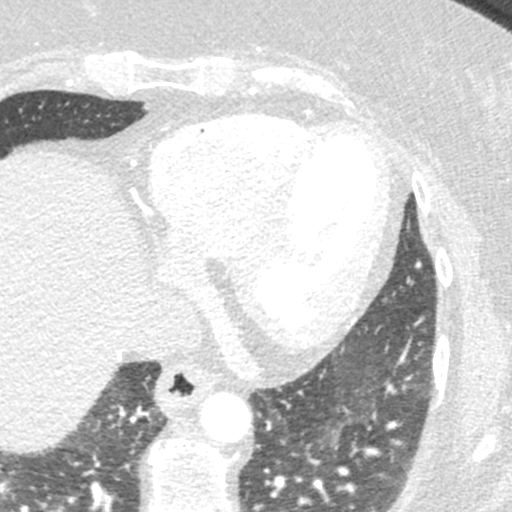
[im 159/239  lung]
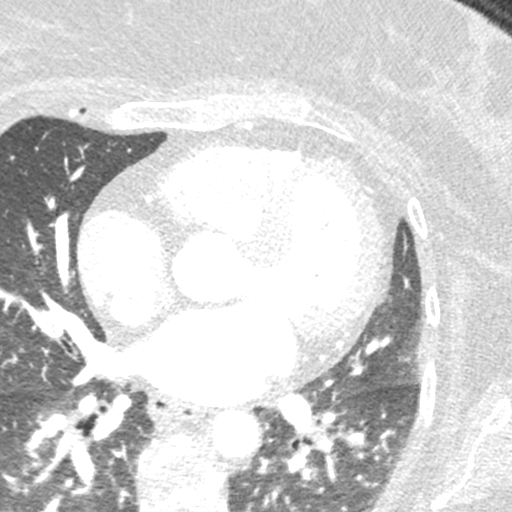

[Series 9: ts syst sharp · axial · 0.39mm/px · z∈[+1208,+1240]mm · 2 of 239 slices shown]
[im 80/239  lung]
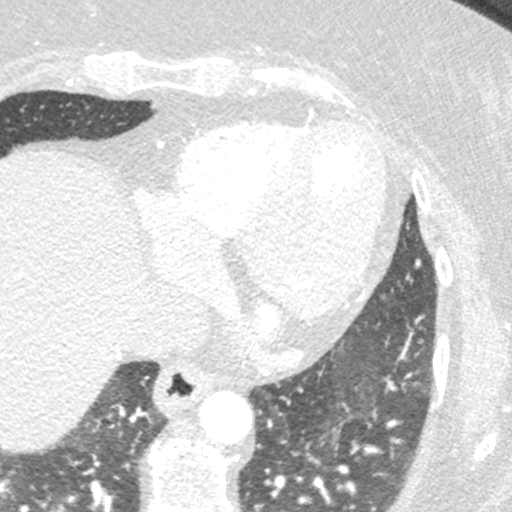
[im 159/239  lung]
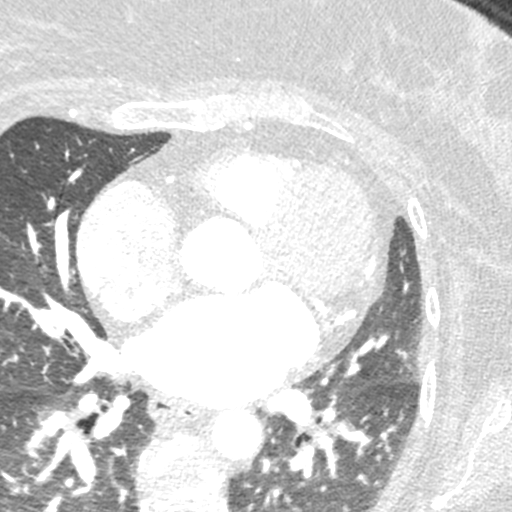

[8 of 20 positions shown; findings below may reference images not displayed]

FINDINGS: Cardiovascular: There are no significant vascular findings.

Mediastinum/Nodes: There are no enlarged lymph nodes.The visualized
esophagus demonstrates no significant findings.

Lungs/Pleura: The visualized lungs are clear. No pneumothorax or
pleural effusion.

Upper abdomen: No acute abnormality.  Small hiatal hernia.

Musculoskeletal/Chest wall: No chest wall abnormality. No acute or
significant osseous findings.
IMPRESSION: 1. No significant extracardiac findings.
2. Small hiatal hernia.
FINDINGS: Image quality: Excellent.

Noise artifact is: Limited.

Coronary Arteries:  Normal coronary origin.  Right dominance.

Left main: The left main is a large caliber vessel with a normal
take off from the left coronary cusp that bifurcates to form a left
anterior descending artery and a left circumflex artery. There is
minimal calcified plaque (<25%).

Left anterior descending artery: The proximal LAD contains minimal
calcified plaque (<25%). The mid and distal segments are patent.
Distal LAD myocardial bridge (normal variant). The LAD gives off 1
patent diagonal branch.

Left circumflex artery: The LCX is non-dominant. The proximal and
mid LCX segments contain minimal calcified plaque (<25%). The LCX
gives off 2 patent obtuse marginal branches.

Right coronary artery: The RCA is dominant with normal take off from
the right coronary cusp. There is no evidence of plaque or stenosis.
The RCA terminates as a PDA and right posterolateral branch without
evidence of plaque or stenosis.

Right Atrium: Right atrial size is within normal limits.

Right Ventricle: The right ventricular cavity is within normal
limits.

Left Atrium: Left atrial size is normal in size with no left atrial
appendage filling defect.

Left Ventricle: The ventricular cavity size is within normal limits.

Pulmonary arteries: Normal in size without proximal filling defect.

Pulmonary veins: Normal pulmonary venous drainage.

Pericardium: Normal thickness without significant effusion or
calcium present.

Cardiac valves: The aortic valve is trileaflet without significant
calcification. The mitral valve is normal without significant
calcification.

Aorta: Normal caliber without significant disease.

Extra-cardiac findings: See attached radiology report for
non-cardiac structures.
IMPRESSION: 1. Coronary calcium score of 37.6. This was 97th percentile for
age-, sex, and race-matched controls.

2. Normal coronary origin with right dominance.

3. Minimal CAD (<25%) in the LAD/LCX.

4. Distal LAD myocardial bridge (normal variant).

RECOMMENDATIONS:
1. Minimal non-obstructive CAD (0-24%). Consider non-atherosclerotic
causes of chest pain. Consider preventive therapy and risk factor
modification.

*** End of Addendum ***
EXAM:
OVER-READ INTERPRETATION  CT CHEST

The following report is an over-read performed by radiologist Dr.
Schoos Kosch [[email or web address]] on 11/10/2021. This
over-read does not include interpretation of cardiac or coronary
anatomy or pathology. The coronary calcium score/coronary CTA
interpretation by the cardiologist is attached.
FINDINGS: Cardiovascular: There are no significant vascular findings.

Mediastinum/Nodes: There are no enlarged lymph nodes.The visualized
esophagus demonstrates no significant findings.

Lungs/Pleura: The visualized lungs are clear. No pneumothorax or
pleural effusion.

Upper abdomen: No acute abnormality.  Small hiatal hernia.

Musculoskeletal/Chest wall: No chest wall abnormality. No acute or
significant osseous findings.
IMPRESSION: 1. No significant extracardiac findings.
2. Small hiatal hernia.

## 2024-02-27 LAB — COLOGUARD: COLOGUARD: NEGATIVE

## 2024-08-14 ENCOUNTER — Other Ambulatory Visit: Payer: Self-pay | Admitting: Nurse Practitioner

## 2024-08-14 DIAGNOSIS — Z1231 Encounter for screening mammogram for malignant neoplasm of breast: Secondary | ICD-10-CM
